# Patient Record
Sex: Male | Born: 2000 | Race: White | Hispanic: No | Marital: Single | Smoking: Never smoker
Health system: Southern US, Community
[De-identification: ages and names within clinical notes are randomized; demographics above are authoritative.]

## PROBLEM LIST (undated history)

## (undated) DIAGNOSIS — R488 Other symbolic dysfunctions: Secondary | ICD-10-CM

## (undated) DIAGNOSIS — F909 Attention-deficit hyperactivity disorder, unspecified type: Secondary | ICD-10-CM

## (undated) DIAGNOSIS — M41129 Adolescent idiopathic scoliosis, site unspecified: Secondary | ICD-10-CM

## (undated) DIAGNOSIS — R278 Other lack of coordination: Secondary | ICD-10-CM

## (undated) HISTORY — DX: Adolescent idiopathic scoliosis, site unspecified: M41.129

## (undated) HISTORY — DX: Other symbolic dysfunctions: R48.8

## (undated) HISTORY — DX: Attention-deficit hyperactivity disorder, unspecified type: F90.9

## (undated) HISTORY — DX: Other lack of coordination: R27.8

---
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
## 2018-01-04 DIAGNOSIS — Z0000 Encounter for general adult medical examination without abnormal findings: Secondary | ICD-10-CM | POA: Diagnosis not present

## 2018-01-04 DIAGNOSIS — Z1389 Encounter for screening for other disorder: Secondary | ICD-10-CM | POA: Diagnosis not present

## 2018-01-07 DIAGNOSIS — Z0000 Encounter for general adult medical examination without abnormal findings: Secondary | ICD-10-CM | POA: Diagnosis not present

## 2018-01-07 DIAGNOSIS — Z13220 Encounter for screening for lipoid disorders: Secondary | ICD-10-CM | POA: Diagnosis not present

## 2018-03-04 ENCOUNTER — Other Ambulatory Visit: Payer: Self-pay | Admitting: Pediatrics

## 2018-03-04 MED ORDER — METHYLPHENIDATE 30 MG/9HR TD PTCH: 1.0000 | MEDICATED_PATCH | Freq: Every day | TRANSDERMAL | 0 refills | Status: DC

## 2018-03-04 NOTE — Telephone Encounter (Signed)
RX for above e-scribed and sent to pharmacy on record  [id number] [place], [place] - [address] [place] [address] Phone: [phone] Fax: [phone]   

## 2018-03-04 NOTE — Telephone Encounter (Signed)
Father called for refill for Daytrana. Last visit 12/21/2017 next visit 04/06/18. [facility] [place]

## 2018-03-05 ENCOUNTER — Other Ambulatory Visit: Payer: Self-pay

## 2018-03-05 MED ORDER — METHYLPHENIDATE 30 MG/9HR TD PTCH: 1.0000 | MEDICATED_PATCH | Freq: Every day | TRANSDERMAL | 0 refills | Status: DC

## 2018-03-05 NOTE — Telephone Encounter (Signed)
RX for above e-scribed and sent to pharmacy on record  [id number] - [place], [place] - [address] [facility] [place] [id number] Phone: [phone] Fax: [phone]

## 2018-03-05 NOTE — Telephone Encounter (Signed)
Dad called [facility] [place]

## 2018-04-06 ENCOUNTER — Institutional Professional Consult (permissible substitution): Payer: BLUE CROSS/BLUE SHIELD | Admitting: Pediatrics

## 2018-04-14 ENCOUNTER — Ambulatory Visit (INDEPENDENT_AMBULATORY_CARE_PROVIDER_SITE_OTHER): Payer: PRIVATE HEALTH INSURANCE | Admitting: Pediatrics

## 2018-04-14 ENCOUNTER — Encounter: Payer: Self-pay | Admitting: Pediatrics

## 2018-04-14 VITALS — BP 110/80 | Ht 72.5 in | Wt 144.6 lb

## 2018-04-14 DIAGNOSIS — R278 Other lack of coordination: Secondary | ICD-10-CM | POA: Diagnosis not present

## 2018-04-14 DIAGNOSIS — Z719 Counseling, unspecified: Secondary | ICD-10-CM | POA: Diagnosis not present

## 2018-04-14 DIAGNOSIS — Z79899 Other long term (current) drug therapy: Secondary | ICD-10-CM

## 2018-04-14 DIAGNOSIS — F902 Attention-deficit hyperactivity disorder, combined type: Secondary | ICD-10-CM | POA: Diagnosis not present

## 2018-04-14 DIAGNOSIS — Z7189 Other specified counseling: Secondary | ICD-10-CM | POA: Diagnosis not present

## 2018-04-14 NOTE — Progress Notes (Signed)
[facility] [address], [place]. [address] [place] [address] Dept: [phone] Dept Fax: [phone] Loc: [phone] Loc Fax: [phone]  Medical Follow-up  Patient ID: Nicholas Baker, male  DOB: 08-17-2001, 17 y.o.  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 No Review of Systems  Constitutional: Negative.  Negative for chills, diaphoresis, fever, malaise/fatigue and weight loss.  HENT: Negative.  Negative for congestion, ear discharge, ear pain, hearing loss, nosebleeds, sinus pain, sore throat and tinnitus.   Eyes: Negative.  Negative for blurred vision, double vision, photophobia, pain, discharge and redness.  Respiratory: Negative.  Negative for cough, hemoptysis, sputum production, shortness of breath, wheezing and stridor.   Cardiovascular: Negative.  Negative for chest pain, palpitations, orthopnea, claudication, leg swelling and PND.  Gastrointestinal: Negative.  Negative for abdominal pain, blood in stool, constipation, diarrhea, heartburn, melena, nausea and vomiting.  Genitourinary: Negative.  Negative for dysuria, flank pain, frequency, hematuria and urgency.  Musculoskeletal: Negative.  Negative for back  pain, falls, joint pain, myalgias and neck pain.  Skin: Negative.  Negative for itching and rash.  Neurological: Negative.  Negative for dizziness, tingling, tremors, sensory change, speech change, focal weakness, seizures, loss of consciousness, weakness and headaches.  Endo/Heme/Allergies: Negative.  Negative for environmental allergies and polydipsia. Does not bruise/bleed easily.  Psychiatric/Behavioral: Negative.  Negative for depression, hallucinations, memory loss, substance abuse and suicidal ideas. The patient is not nervous/anxious and does not have insomnia.     Allergies: Patient has no known allergies.  Current Medications:  Current Outpatient Medications:  .  methylphenidate (DAYTRANA) 30 MG/9HR, Place 1 patch onto the skin daily., Disp: 30 patch, Rfl: 0 Medication Side Effects: None  Family Medical/Social History Changes?: No  MENTAL HEALTH: Mental Health Issues: good social skills  PHYSICAL EXAM: Vitals:  Today's Vitals   04/14/18 1615  BP: 110/80  Weight: 144 lb 9.6 oz (65.6 kg)  Height: 6' 0.5" (1.842 m)  PainSc: 0-No pain  , 10 %ile (Z= -1.27) based on CDC (Boys, 2-20 Years) BMI-for-age based on BMI available as of 04/14/2018.  General Exam: Physical Exam  Constitutional: He is oriented to person, place, and time. He appears well-developed and well-nourished. No distress.  HENT:  Head: Normocephalic and atraumatic.  Right Ear: External ear normal.  Left Ear: External ear normal.  Nose: Nose normal.  Mouth/Throat: Oropharynx is clear and moist. No oropharyngeal exudate.  Eyes: Pupils are equal, round, and reactive to light. Conjunctivae and EOM are normal. Right eye exhibits no discharge. Left eye exhibits no discharge. No scleral icterus.  Neck: Normal range of motion. Neck supple. No JVD present. No tracheal deviation present. No thyromegaly present.  Cardiovascular: Normal rate, regular rhythm, normal heart sounds and intact distal pulses. Exam reveals no  gallop and no friction rub.  No murmur heard. Pulmonary/Chest: Effort normal and breath sounds normal. No stridor. No respiratory distress. He has  no wheezes. He has no rales. He exhibits no tenderness.  Abdominal: Soft. Bowel sounds are normal. He exhibits no distension and no mass. There is no tenderness. There is no rebound and no guarding. No hernia.  Musculoskeletal: Normal range of motion. He exhibits no edema, tenderness or deformity.  Lymphadenopathy:    He has no cervical adenopathy.  Neurological: He is alert and oriented to person, place, and time. He has normal reflexes. He displays normal reflexes. No cranial nerve deficit or sensory deficit. He exhibits normal muscle tone. Coordination normal.  Skin: Skin is warm and dry. No rash noted. He is not diaphoretic. No erythema. No pallor.  Psychiatric: He has a normal mood and affect. His behavior is normal. Judgment and thought content normal.  Vitals reviewed.   Neurological: oriented to time, place, and person Cranial Nerves: normal  Neuromuscular:  Motor Mass: normal Tone: normal Strength: normal DTRs: 2+ and symmetric Overflow: mild Reflexes: no tremors noted, finger to nose without dysmetria bilaterally, performs thumb to finger exercise without difficulty, gait was normal and tandem gait was normal Sensory Exam: normal  Fine Touch: normal  Testing/Developmental Screens:  AS/RS 12/10  DIAGNOSES:    ICD-10-CM   1. ADHD (attention deficit hyperactivity disorder), combined type F90.2   2. Dysgraphia R27.8   3. Medication management Z79.899   4. Patient counseled Z71.9   5. Counseling and coordination of care Z71.89     RECOMMENDATIONS:  Patient Instructions  Continue daytrana 30 mg 1/2 to 1 patch every morning Discussed medication and dosing Discussed care of medication at college Discussed school progress-recommend not overdoing first semester  Discussed growth and development-good gain-BMI still low-increase  calories    NEXT APPOINTMENT: Return in about 3 months (around 07/22/2018), or if symptoms worsen or fail to improve, for Medical follow up.   [name], NP Counseling Time: 30 Total Contact Time: 40 More than 50% of the visit involved counseling, discussing the diagnosis and management of symptoms with the patient and family

## 2018-04-14 NOTE — Patient Instructions (Addendum)
Continue daytrana 30 mg 1/2 to 1 patch every morning Discussed medication and dosing Discussed care of medication at college Discussed school progress-recommend not overdoing first semester  Discussed growth and development-good gain-BMI still low-increase calories

## 2018-05-10 ENCOUNTER — Other Ambulatory Visit: Payer: Self-pay

## 2018-05-10 MED ORDER — METHYLPHENIDATE 30 MG/9HR TD PTCH: 1.0000 | MEDICATED_PATCH | Freq: Every day | TRANSDERMAL | 0 refills | Status: DC

## 2018-05-10 NOTE — Telephone Encounter (Signed)
E-Prescribed Daytrana 30 mg directly to  [facility] [id number] - [place], [place] - [address] [place] [address] Phone: [phone] Fax: [phone]

## 2018-05-10 NOTE — Telephone Encounter (Signed)
Dad called in for refill for Daytrana. Last visit 04/14/2018. Please escribe to [facility] [place], [place]

## 2018-05-17 ENCOUNTER — Telehealth: Payer: Self-pay

## 2018-05-17 NOTE — Telephone Encounter (Signed)
Outcome  Approvedtoday  Request Reference Number: [id number]. DAYTRANA DIS 30MG /9HR is approved through 05/18/2019. For further questions, call [phone].

## 2018-05-17 NOTE — Telephone Encounter (Signed)
Pharm faxed in Prior Auth for Daytrana. Last visit 04/14/2018. Submitted Prior Auth to [employer]

## 2018-08-13 ENCOUNTER — Other Ambulatory Visit: Payer: Self-pay

## 2018-08-13 MED ORDER — METHYLPHENIDATE 30 MG/9HR TD PTCH: 1.0000 | MEDICATED_PATCH | Freq: Every day | TRANSDERMAL | 0 refills | Status: DC

## 2018-08-13 NOTE — Telephone Encounter (Signed)
RX for above e-scribed and sent to pharmacy on record  [id number] - [place], [place] - [address] [place] [address] Phone: [phone] Fax: [phone]   

## 2018-08-13 NOTE — Telephone Encounter (Signed)
Patient called in for refill for Daytrana. Last visit 04/14/2018. Please escribe to [facility] in [place], [place]

## 2018-09-27 ENCOUNTER — Institutional Professional Consult (permissible substitution): Payer: PRIVATE HEALTH INSURANCE | Admitting: Pediatrics

## 2018-11-25 ENCOUNTER — Encounter: Payer: Self-pay | Admitting: Pediatrics

## 2018-11-25 ENCOUNTER — Ambulatory Visit (INDEPENDENT_AMBULATORY_CARE_PROVIDER_SITE_OTHER): Payer: PRIVATE HEALTH INSURANCE | Admitting: Pediatrics

## 2018-11-25 VITALS — BP 116/80 | Ht 72.5 in | Wt 155.4 lb

## 2018-11-25 DIAGNOSIS — Z719 Counseling, unspecified: Secondary | ICD-10-CM | POA: Diagnosis not present

## 2018-11-25 DIAGNOSIS — Z79899 Other long term (current) drug therapy: Secondary | ICD-10-CM | POA: Diagnosis not present

## 2018-11-25 DIAGNOSIS — Z7189 Other specified counseling: Secondary | ICD-10-CM

## 2018-11-25 DIAGNOSIS — F902 Attention-deficit hyperactivity disorder, combined type: Secondary | ICD-10-CM

## 2018-11-25 DIAGNOSIS — R278 Other lack of coordination: Secondary | ICD-10-CM

## 2018-11-25 MED ORDER — METHYLPHENIDATE 30 MG/9HR TD PTCH: 1.0000 | MEDICATED_PATCH | Freq: Every day | TRANSDERMAL | 0 refills | Status: AC

## 2018-11-25 NOTE — Patient Instructions (Addendum)
Continue daytrana 30 mg every morning as needed Discussed medication and dosing Discussed growth and development-good BMI Discussed school progress-good transition to college Discussed health/post surgery

## 2018-11-25 NOTE — Progress Notes (Signed)
[facility] [address] [place] [id number] Dept: [phone] Dept Fax: [phone] Loc: [phone] Loc Fax: [phone]  Medical Follow-up  Patient ID: Nicholas Baker, male  DOB: Dec 27, 2000, 17 y.o.  MRN: [id number]  Date of Evaluation: 11/25/18  PCP: Berline Lopes'Kelley, Brian, MD  Accompanied by: self Patient Lives with: parents  HISTORY/CURRENT STATUS:  HPI  Routine 3 month visit, medication check  EDUCATION: School: [employer] Year/Grade: first year  Performance/Grades: above average 1B, 1C, rest A's Services: Other: none Activities/Exercise: walking on campus  MEDICAL HISTORY: Appetite: good  Sleep: Bedtime: varies10-12 Awakens: varies 9-10 Sleep Concerns: Initiation/Maintenance/Other: sleeps well  Individual Medical History/Review of System Changes? No, wisdom pulled 3 days ago-doing well Review of Systems  Constitutional: Negative.  Negative for chills, diaphoresis, fever, malaise/fatigue and weight loss.  HENT: Negative.  Negative for congestion, ear discharge, ear pain, hearing loss, nosebleeds, sinus pain, sore throat and tinnitus.   Eyes: Negative.  Negative for blurred vision, double vision, photophobia, pain, discharge and redness.  Respiratory: Negative.  Negative for cough, hemoptysis, sputum production, shortness of breath, wheezing and stridor.   Cardiovascular: Negative.  Negative for chest pain, palpitations, orthopnea, claudication, leg swelling and PND.  Gastrointestinal: Negative.  Negative for abdominal pain, blood in stool, constipation, diarrhea, heartburn, melena, nausea and vomiting.  Genitourinary: Negative.  Negative for dysuria, flank pain, frequency, hematuria and urgency.  Musculoskeletal: Negative.  Negative for back pain, falls, joint pain, myalgias and neck pain.  Skin: Negative.  Negative for itching  and rash.  Neurological: Negative.  Negative for dizziness, tingling, tremors, sensory change, speech change, focal weakness, seizures, loss of consciousness, weakness and headaches.  Endo/Heme/Allergies: Negative.  Negative for environmental allergies and polydipsia. Does not bruise/bleed easily.  Psychiatric/Behavioral: Negative.  Negative for depression, hallucinations, memory loss, substance abuse and suicidal ideas. The patient is not nervous/anxious and does not have insomnia.      Allergies: Patient has no known allergies.  Current Medications:  Current Outpatient Medications:  .  methylphenidate (DAYTRANA) 30 MG/9HR, Place 1 patch onto the skin daily., Disp: 30 patch, Rfl: 0 Medication Side Effects: None  Family Medical/Social History Changes?: No  MENTAL HEALTH: Mental Health Issues: good social skills  PHYSICAL EXAM: Vitals:  Today's Vitals   11/25/18 1122  BP: 116/80  Weight: 155 lb 6.4 oz (70.5 kg)  Height: 6' 0.5" (1.842 m)  PainSc: 0-No pain  , 23 %ile (Z= -0.73) based on CDC (Boys, 2-20 Years) BMI-for-age based on BMI available as of 11/25/2018.  General Exam: Physical Exam Vitals signs reviewed.  Constitutional:      General: He is not in acute distress.    Appearance: Normal appearance. He is well-developed and normal weight. He is not diaphoretic.  HENT:     Head: Normocephalic and atraumatic.     Right Ear: Tympanic membrane, ear canal and external ear normal.     Left Ear: Tympanic membrane, ear canal and external ear normal.     Nose: Nose normal.     Mouth/Throat:     Mouth: Mucous membranes are moist.     Pharynx: Oropharynx is clear. No oropharyngeal exudate.  Eyes:     General: No scleral icterus.       Right eye: No discharge.        Left eye: No discharge.     Extraocular Movements: Extraocular movements intact.     Conjunctiva/sclera: Conjunctivae  normal.     Pupils: Pupils are equal, round, and reactive to light.  Neck:      Musculoskeletal: Normal range of motion and neck supple. No neck rigidity or muscular tenderness.     Thyroid: No thyromegaly.     Vascular: No JVD.     Trachea: No tracheal deviation.  Cardiovascular:     Rate and Rhythm: Normal rate and regular rhythm.     Pulses: Normal pulses.     Heart sounds: Normal heart sounds. No murmur. No friction rub. No gallop.   Pulmonary:     Effort: Pulmonary effort is normal. No respiratory distress.     Breath sounds: Normal breath sounds. No stridor. No wheezing or rales.  Chest:     Chest wall: No tenderness.  Abdominal:     General: Bowel sounds are normal. There is no distension.     Palpations: Abdomen is soft. There is no mass.     Tenderness: There is no abdominal tenderness. There is no guarding or rebound.     Hernia: No hernia is present.  Musculoskeletal: Normal range of motion.        General: No tenderness.  Lymphadenopathy:     Cervical: No cervical adenopathy.  Skin:    General: Skin is warm and dry.     Coloration: Skin is not pale.     Findings: No erythema or rash.  Neurological:     Mental Status: He is alert and oriented to person, place, and time.     Cranial Nerves: No cranial nerve deficit.     Motor: No abnormal muscle tone.     Coordination: Coordination normal.     Deep Tendon Reflexes: Reflexes are normal and symmetric. Reflexes normal.  Psychiatric:        Behavior: Behavior normal.        Thought Content: Thought content normal.        Judgment: Judgment normal.     Neurological: oriented to time, place, and person Cranial Nerves: normal  Neuromuscular:  Motor Mass: normal Tone: normal Strength: normal DTRs: 2+ and symmetric Overflow: mild Reflexes: no tremors noted, finger to nose without dysmetria bilaterally, performs thumb to finger exercise without difficulty, gait was normal, tandem gait was normal and no ataxic movements noted Sensory Exam: normal  Fine Touch: normal  Testing/Developmental Screens:   AS/RS 15/12  DIAGNOSES:    ICD-10-CM   1. ADHD (attention deficit hyperactivity disorder), combined type F90.2   2. Dysgraphia R27.8   3. Medication management Z79.899   4. Patient counseled Z71.9   5. Counseling and coordination of care Z71.89     RECOMMENDATIONS:  Patient Instructions  Continue daytrana 30 mg every morning as needed Discussed medication and dosing Discussed growth and development-good BMI Discussed school progress-good transition to college Discussed health/post surgery   NEXT APPOINTMENT: Return in about 3 months (around 02/24/2019), or if symptoms worsen or fail to improve, for Medical follow up.   Nicholos JohnsJoyce P Phylis Javed, NP Counseling Time: 30 Total Contact Time: 40 More than 50% of the visit involved counseling, discussing the diagnosis and management of symptoms with the patient and family

## 2019-04-20 ENCOUNTER — Other Ambulatory Visit: Payer: Self-pay | Admitting: Internal Medicine

## 2019-04-20 DIAGNOSIS — R109 Unspecified abdominal pain: Secondary | ICD-10-CM

## 2019-04-21 ENCOUNTER — Ambulatory Visit
Admission: RE | Admit: 2019-04-21 | Discharge: 2019-04-21 | Disposition: A | Discharge status: Home / Self Care | Payer: PRIVATE HEALTH INSURANCE | Source: Ambulatory Visit | Attending: Internal Medicine | Admitting: Internal Medicine

## 2019-04-21 DIAGNOSIS — R109 Unspecified abdominal pain: Secondary | ICD-10-CM

## 2019-11-24 ENCOUNTER — Ambulatory Visit: Payer: BC Managed Care – PPO | Attending: Internal Medicine

## 2019-11-24 DIAGNOSIS — Z20822 Contact with and (suspected) exposure to covid-19: Secondary | ICD-10-CM

## 2019-11-24 DIAGNOSIS — Z20828 Contact with and (suspected) exposure to other viral communicable diseases: Secondary | ICD-10-CM | POA: Diagnosis not present

## 2019-11-26 LAB — NOVEL CORONAVIRUS, NAA: SARS-CoV-2, NAA: NOT DETECTED

## 2020-10-01 IMAGING — RF UPPER GI SERIES W/HIGH DENSITY WITHOUT KUB
4 series · 14 of 18 positions shown · non-contrast
Comparison: None.

CLINICAL DATA: Abdominal pain.

EXAM:
UPPER GI SERIES WITH KUB
TECHNIQUE: After obtaining a scout radiograph a routine upper GI series was
performed using thin and high density barium.
FLUOROSCOPY TIME:  Fluoroscopy Time:  2 minutes and 12 seconds
Radiation Exposure Index (if provided by the fluoroscopic device):
100 mGy
Number of Acquired Spot Images: 5

[Series 1: one shot · 0.14mm/px · 5 of 6 slices shown]
[im 1/6]
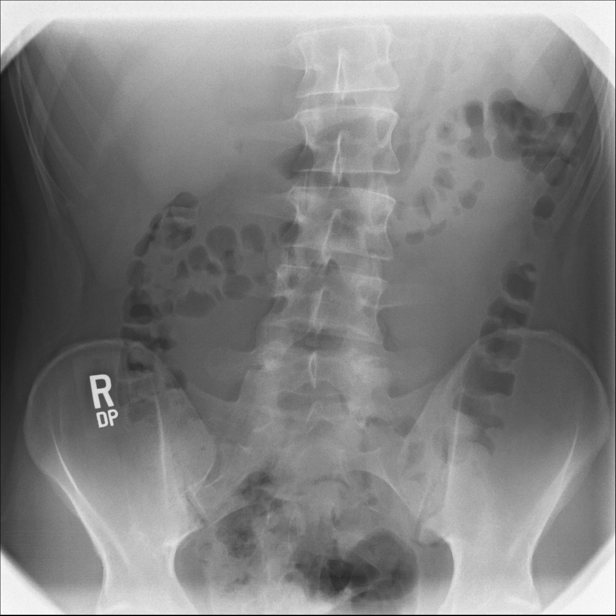
[im 2/6]
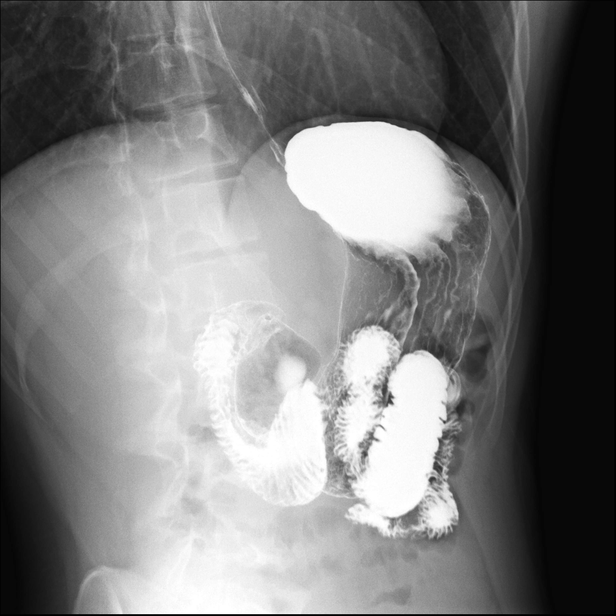
[im 4/6]
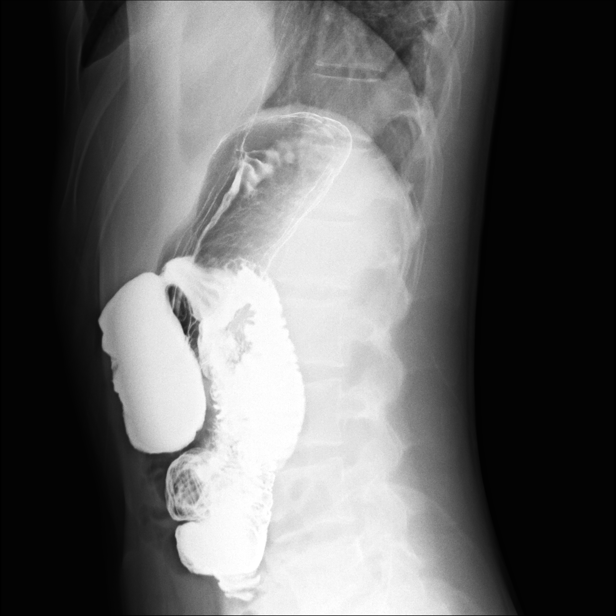
[im 5/6]
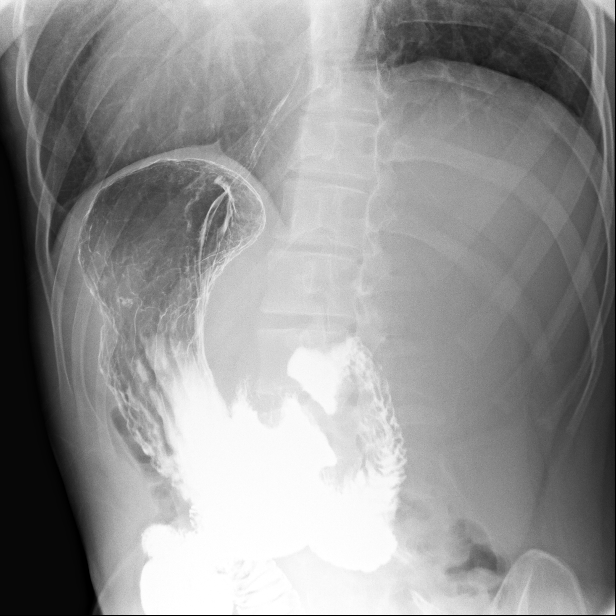
[im 6/6]
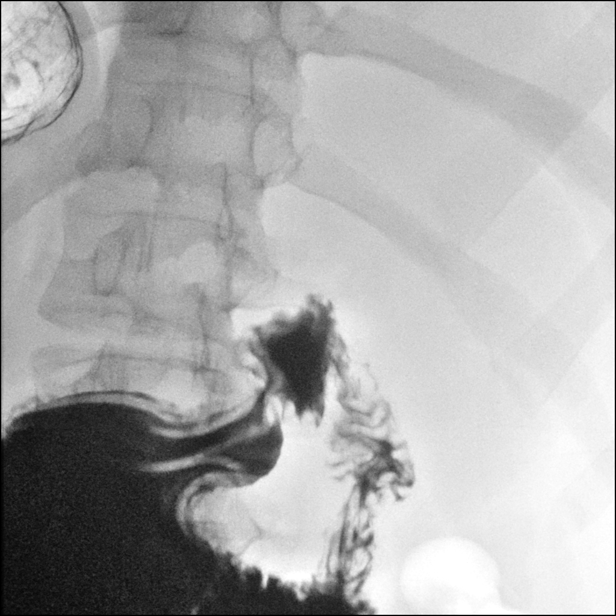

[Series 2: sequence · 3 of 46 frames shown (1 of 3)]
[frame 20/46]
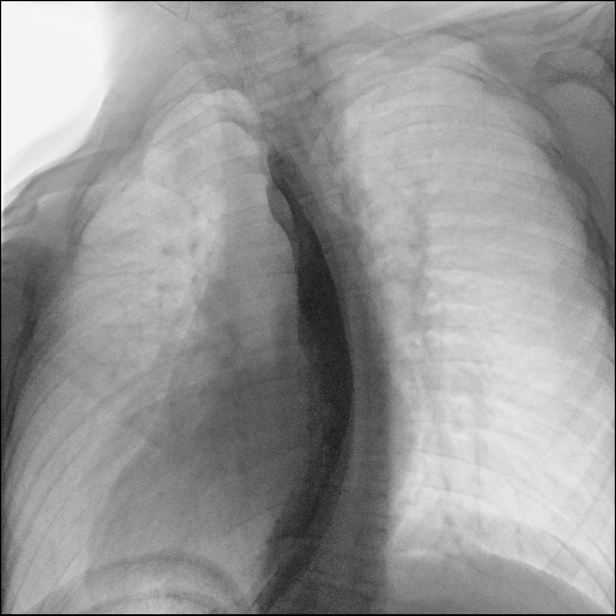
[frame 24/46]
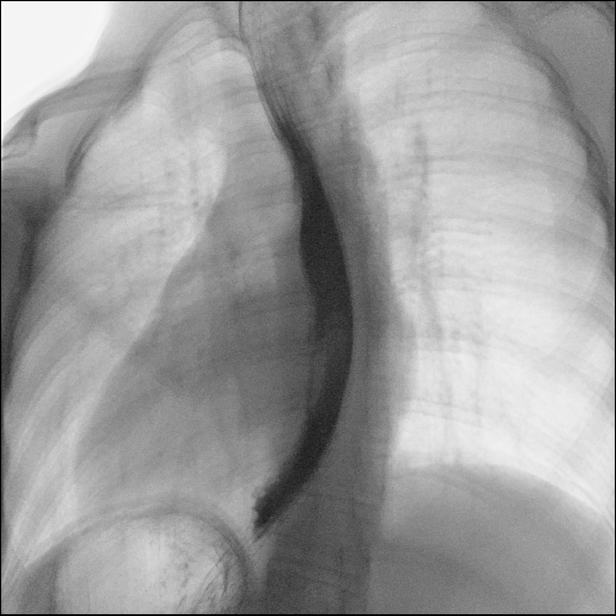
[frame 40/46]
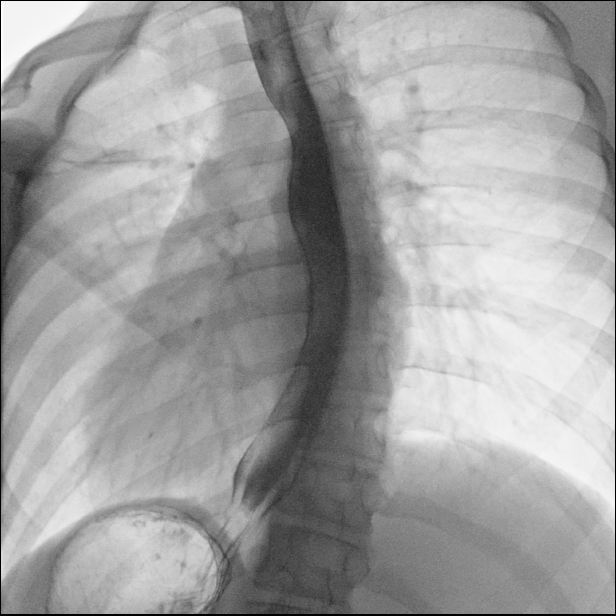

[Series 3: sequence · 3 of 47 frames shown (2 of 3)]
[frame 8/47]
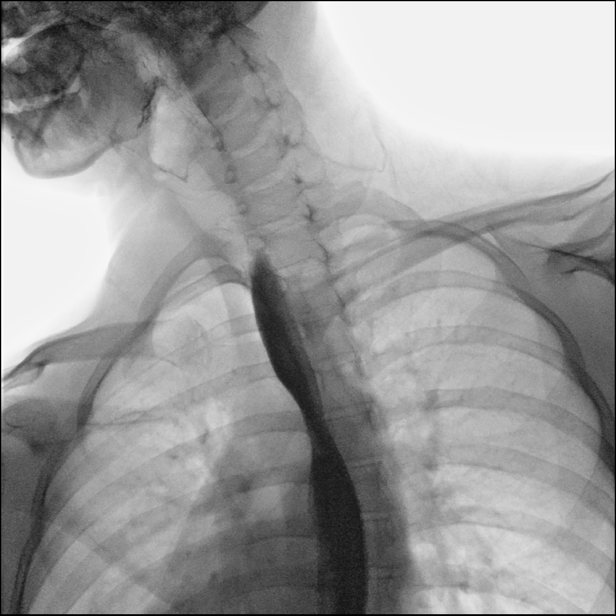
[frame 32/47]
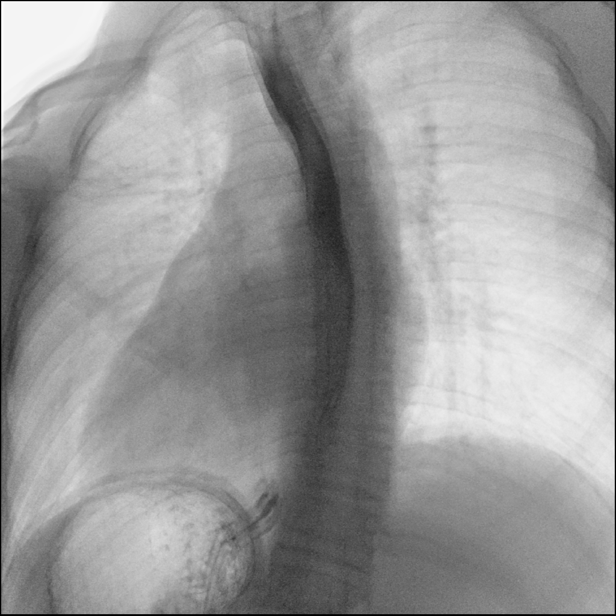
[frame 40/47]
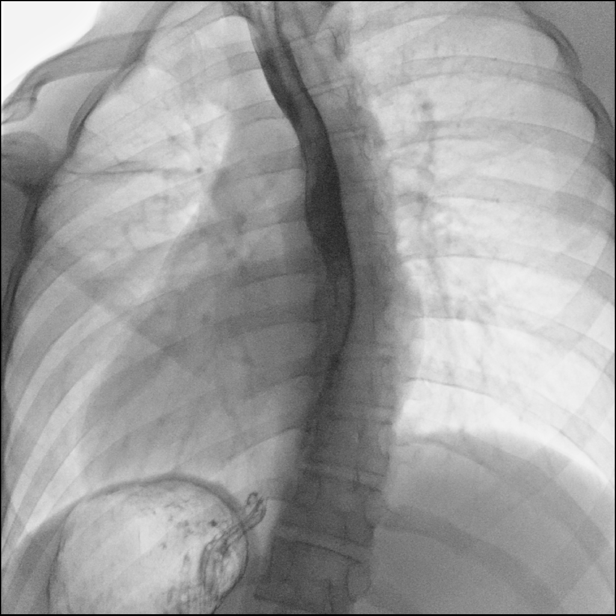

[Series 4: sequence · 3 of 53 frames shown (3 of 3)]
[frame 8/53]
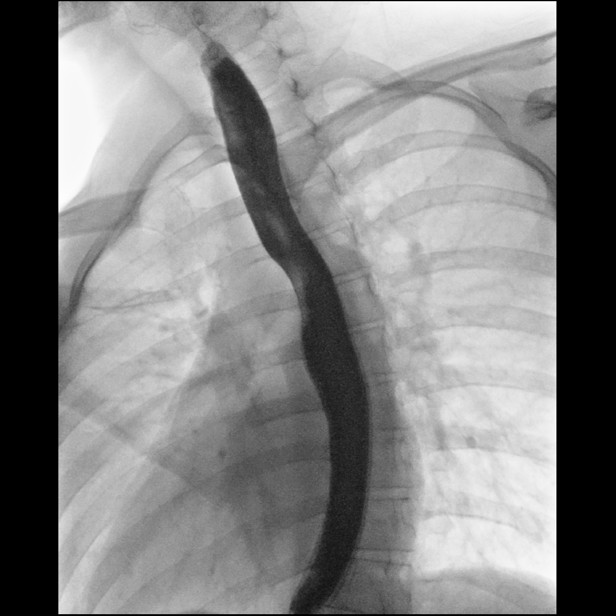
[frame 45/53]
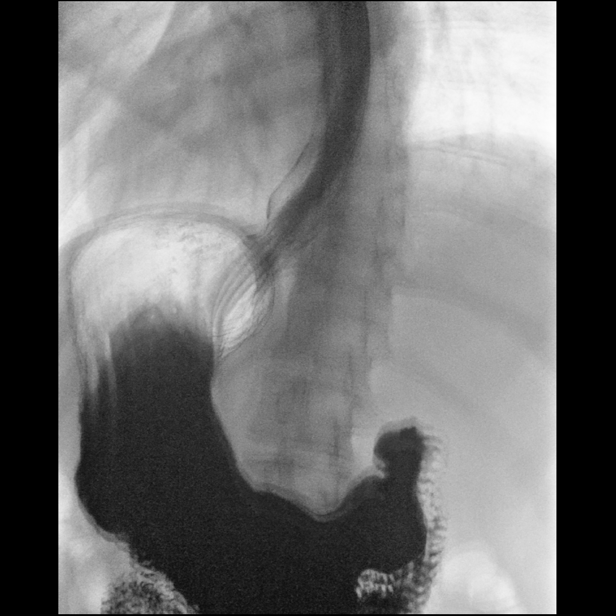
[frame 46/53]
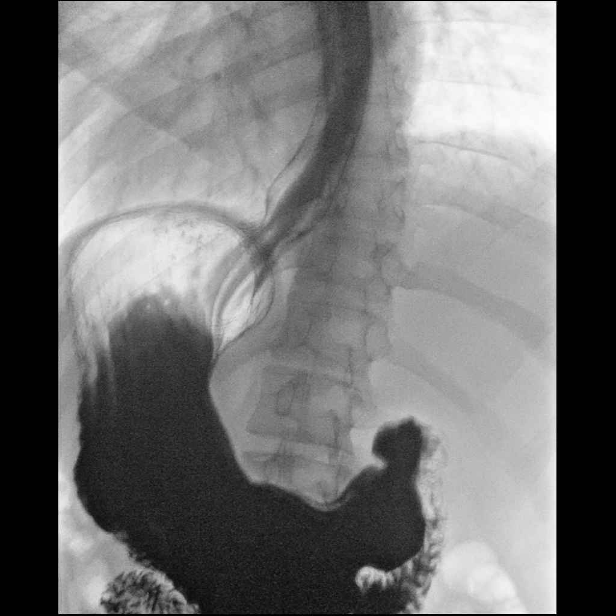

[14 of 18 positions shown; findings below may reference images not displayed]

FINDINGS: Preprocedure scout film is unremarkable save minimal convex left
lumbar spine curvature.

Double contrast evaluation of the stomach demonstrates no mass,
ulcer, or fold abnormality.

Normal appearance of the duodenal bulb and C-loop.

Evaluation of esophageal peristalsis demonstrates mild nonspecific
contrast stasis in the upper and mid esophagus.

Full column evaluation of the esophagus demonstrates no persistent
narrowing or stricture.
IMPRESSION: No acute process or explanation for abdominal pain.
